# Patient Record
Sex: Female | Born: 1955 | Race: Black or African American | Hispanic: No | Marital: Single | Smoking: Never smoker
Health system: Southern US, Community
[De-identification: ages and names within clinical notes are randomized; demographics above are authoritative.]

## PROBLEM LIST (undated history)

## (undated) DIAGNOSIS — N842 Polyp of vagina: Secondary | ICD-10-CM

## (undated) DIAGNOSIS — F32A Depression, unspecified: Secondary | ICD-10-CM

## (undated) DIAGNOSIS — F4310 Post-traumatic stress disorder, unspecified: Secondary | ICD-10-CM

## (undated) DIAGNOSIS — Z789 Other specified health status: Secondary | ICD-10-CM

## (undated) DIAGNOSIS — F329 Major depressive disorder, single episode, unspecified: Secondary | ICD-10-CM

## (undated) DIAGNOSIS — R87619 Unspecified abnormal cytological findings in specimens from cervix uteri: Secondary | ICD-10-CM

## (undated) DIAGNOSIS — D649 Anemia, unspecified: Secondary | ICD-10-CM

## (undated) DIAGNOSIS — K219 Gastro-esophageal reflux disease without esophagitis: Secondary | ICD-10-CM

## (undated) DIAGNOSIS — F419 Anxiety disorder, unspecified: Secondary | ICD-10-CM

## (undated) HISTORY — DX: Polyp of vagina: N84.2

## (undated) HISTORY — DX: Gastro-esophageal reflux disease without esophagitis: K21.9

## (undated) HISTORY — DX: Depression, unspecified: F32.A

## (undated) HISTORY — DX: Major depressive disorder, single episode, unspecified: F32.9

## (undated) HISTORY — DX: Post-traumatic stress disorder, unspecified: F43.10

## (undated) HISTORY — PX: NO PAST SURGERIES: SHX2092

## (undated) HISTORY — DX: Unspecified abnormal cytological findings in specimens from cervix uteri: R87.619

## (undated) HISTORY — DX: Anemia, unspecified: D64.9

## (undated) HISTORY — DX: Anxiety disorder, unspecified: F41.9

---

## 2007-06-11 HISTORY — PX: COLPOSCOPY: SHX161

## 2016-06-12 ENCOUNTER — Ambulatory Visit: Payer: Self-pay

## 2016-06-26 ENCOUNTER — Ambulatory Visit: Payer: Medicaid Other

## 2016-09-02 ENCOUNTER — Observation Stay
Admission: EM | Admit: 2016-09-02 | Discharge: 2016-09-03 | Disposition: A | Discharge status: Home / Self Care | Payer: Medicaid Other | Attending: Internal Medicine | Admitting: Internal Medicine

## 2016-09-02 ENCOUNTER — Ambulatory Visit (INDEPENDENT_AMBULATORY_CARE_PROVIDER_SITE_OTHER)
Admission: EM | Admit: 2016-09-02 | Discharge: 2016-09-02 | Disposition: A | Discharge status: Home / Self Care | Payer: Medicaid Other | Source: Home / Self Care | Attending: Emergency Medicine | Admitting: Emergency Medicine

## 2016-09-02 ENCOUNTER — Encounter: Payer: Self-pay | Admitting: Internal Medicine

## 2016-09-02 ENCOUNTER — Emergency Department: Payer: Medicaid Other

## 2016-09-02 DIAGNOSIS — R001 Bradycardia, unspecified: Secondary | ICD-10-CM

## 2016-09-02 DIAGNOSIS — R61 Generalized hyperhidrosis: Secondary | ICD-10-CM | POA: Insufficient documentation

## 2016-09-02 DIAGNOSIS — R112 Nausea with vomiting, unspecified: Secondary | ICD-10-CM | POA: Diagnosis not present

## 2016-09-02 DIAGNOSIS — F419 Anxiety disorder, unspecified: Secondary | ICD-10-CM | POA: Insufficient documentation

## 2016-09-02 DIAGNOSIS — R079 Chest pain, unspecified: Secondary | ICD-10-CM | POA: Diagnosis not present

## 2016-09-02 DIAGNOSIS — R55 Syncope and collapse: Secondary | ICD-10-CM | POA: Insufficient documentation

## 2016-09-02 DIAGNOSIS — K219 Gastro-esophageal reflux disease without esophagitis: Secondary | ICD-10-CM | POA: Insufficient documentation

## 2016-09-02 DIAGNOSIS — E876 Hypokalemia: Secondary | ICD-10-CM

## 2016-09-02 DIAGNOSIS — R0602 Shortness of breath: Secondary | ICD-10-CM | POA: Diagnosis not present

## 2016-09-02 HISTORY — DX: Other specified health status: Z78.9

## 2016-09-02 LAB — BASIC METABOLIC PANEL
ANION GAP: 9 (ref 5–15)
BUN: 10 mg/dL (ref 6–20)
CALCIUM: 9.9 mg/dL (ref 8.9–10.3)
CHLORIDE: 104 mmol/L (ref 101–111)
CO2: 27 mmol/L (ref 22–32)
CREATININE: 0.55 mg/dL (ref 0.44–1.00)
GFR calc Af Amer: >60 mL/min (ref >60)
GFR calc non Af Amer: >60 mL/min (ref >60)
Glucose, Bld: 107 mg/dL — ABNORMAL HIGH (ref 65–99)
Potassium: 3.1 mmol/L — ABNORMAL LOW (ref 3.5–5.1)
Sodium: 140 mmol/L (ref 135–145)

## 2016-09-02 LAB — GLUCOSE, CAPILLARY: GLUCOSE-CAPILLARY: 119 mg/dL — AB (ref 65–99)

## 2016-09-02 LAB — CBC
HCT: 40.5 % (ref 35.0–47.0)
Hemoglobin: 13.4 g/dL (ref 12.0–16.0)
MCH: 28 pg (ref 26.0–34.0)
MCHC: 33.1 g/dL (ref 32.0–36.0)
MCV: 84.5 fL (ref 80.0–100.0)
PLATELETS: 255 10*3/uL (ref 150–440)
RBC: 4.79 MIL/uL (ref 3.80–5.20)
RDW: 13.3 % (ref 11.5–14.5)
WBC: 8.5 10*3/uL (ref 3.6–11.0)

## 2016-09-02 LAB — TROPONIN I

## 2016-09-02 MED ORDER — ASPIRIN 81 MG PO CHEW
324.0000 mg | CHEWABLE_TABLET | Freq: Once | ORAL | Status: DC
  Filled 2016-09-02: qty 4

## 2016-09-02 MED ORDER — POTASSIUM CHLORIDE CRYS ER 20 MEQ PO TBCR
40.0000 meq | EXTENDED_RELEASE_TABLET | Freq: Once | ORAL | Status: DC
  Filled 2016-09-02: qty 2

## 2016-09-02 MED ORDER — ONDANSETRON 4 MG PO TBDP
4.0000 mg | ORAL_TABLET | Freq: Once | ORAL | Status: AC
  Administered 2016-09-02: 4 mg via ORAL

## 2016-09-02 MED ORDER — POTASSIUM CHLORIDE CRYS ER 20 MEQ PO TBCR
40.0000 meq | EXTENDED_RELEASE_TABLET | Freq: Once | ORAL | Status: AC
  Administered 2016-09-02: 40 meq via ORAL

## 2016-09-02 MED ORDER — ASPIRIN 81 MG PO CHEW
324.0000 mg | CHEWABLE_TABLET | Freq: Once | ORAL | Status: AC
  Administered 2016-09-02: 324 mg via ORAL

## 2016-09-02 MED ORDER — ONDANSETRON 4 MG PO TBDP
ORAL_TABLET | ORAL | Status: AC
  Filled 2016-09-02: qty 1

## 2016-09-02 NOTE — ED Notes (Signed)
Pt pulled to radiology for chest xray  Protocols not completed

## 2016-09-02 NOTE — ED Triage Notes (Signed)
Pt arrives ambulatory to triage with reports of midsternal chest pain pt reports that it has been happening off and on but today it is worse  She also reports a syncopal episode last week

## 2016-09-02 NOTE — H&P (Signed)
Heidi Palmer at [place] NAME: Heidi Palmer    MR#:  [id number]  DATE OF BIRTH:  [birth year]  DATE OF ADMISSION:  09/02/2016  PRIMARY CARE PHYSICIAN: No PCP Per Patient   REQUESTING/REFERRING PHYSICIAN: Mariea Clonts, MD  CHIEF COMPLAINT:   Chief Complaint  Patient presents with  . Chest Pain    HISTORY OF PRESENT ILLNESS:  Heidi Palmer  is a 61 y.o. female who presents with A myriad of complaints, foremost of which is chest pain. Patient states that she has no medical conditions diagnosed and takes no medications normally. For the past several weeks she's been having intermittent bouts of chest pain. These are centrally located, sometimes with radiation to her back. At times they've been associated with diaphoresis. She states that the pain usually occurs when she is exerting herself or when she feels very "stressed." She also states that she has had several near syncopal episodes. She states that she gets cramping in her legs when she is walking certain distance. Initial workup in the ED is largely within normal limits. Given her symptoms hospitalists were called for admission and further evaluation.  PAST MEDICAL HISTORY:   Past Medical History:  Diagnosis Date  . Patient denies medical problems     PAST SURGICAL HISTORY:   Past Surgical History:  Procedure Laterality Date  . NO PAST SURGERIES      SOCIAL HISTORY:   Social History  Substance Use Topics  . Smoking status: Never Smoker  . Smokeless tobacco: Never Used  . Alcohol use No    FAMILY HISTORY:  No family history on file.  DRUG ALLERGIES:   Allergies  Allergen Reactions  . Codeine Nausea Only  . Penicillins Nausea Only    Has patient had a PCN reaction causing immediate rash, facial/tongue/throat swelling, SOB or lightheadedness with hypotension: No Has patient had a PCN reaction causing severe rash involving mucus membranes or skin necrosis: No Has patient  had a PCN reaction that required hospitalization No Has patient had a PCN reaction occurring within the last 10 years: No If all of the above answers are "NO", then may proceed with Cephalosporin use.    MEDICATIONS AT HOME:   Prior to Admission medications   Not on File    REVIEW OF SYSTEMS:  Review of Systems  Constitutional: Positive for diaphoresis. Negative for chills, fever, malaise/fatigue and weight loss.  HENT: Negative for ear pain, hearing loss and tinnitus.   Eyes: Negative for blurred vision, double vision, pain and redness.  Respiratory: Negative for cough, hemoptysis and shortness of breath.   Cardiovascular: Positive for chest pain and claudication. Negative for palpitations, orthopnea and leg swelling.  Gastrointestinal: Negative for abdominal pain, constipation, diarrhea, nausea and vomiting.  Genitourinary: Negative for dysuria, frequency and hematuria.  Musculoskeletal: Negative for back pain, joint pain and neck pain.  Skin:       No acne, rash, or lesions  Neurological: Negative for dizziness, tremors, focal weakness and weakness.  Endo/Heme/Allergies: Negative for polydipsia. Does not bruise/bleed easily.  Psychiatric/Behavioral: Negative for depression. The patient is not nervous/anxious and does not have insomnia.      VITAL SIGNS:   Vitals:   09/02/16 1846  Weight: 77.1 kg (170 lb)  Height: 5' 6"  (1.676 m)   Wt Readings from Last 3 Encounters:  09/02/16 77.1 kg (170 lb)  09/02/16 77.1 kg (170 lb)    PHYSICAL EXAMINATION:  Physical Exam  Vitals reviewed. Constitutional: She is  oriented to person, place, and time. She appears well-developed and well-nourished. No distress.  HENT:  Head: Normocephalic and atraumatic.  Mouth/Throat: Oropharynx is clear and moist.  Eyes: Conjunctivae and EOM are normal. Pupils are equal, round, and reactive to light. No scleral icterus.  Neck: Normal range of motion. Neck supple. No JVD present. No thyromegaly  present.  Cardiovascular: Normal rate, regular rhythm and intact distal pulses.  Exam reveals no gallop and no friction rub.   No murmur heard. Respiratory: Effort normal and breath sounds normal. No respiratory distress. She has no wheezes. She has no rales.  GI: Soft. Bowel sounds are normal. She exhibits no distension. There is no tenderness.  Musculoskeletal: Normal range of motion. She exhibits no edema.  No arthritis, no gout  Lymphadenopathy:    She has no cervical adenopathy.  Neurological: She is alert and oriented to person, place, and time. No cranial nerve deficit.  No dysarthria, no aphasia  Skin: Skin is warm and dry. No rash noted. No erythema.  Psychiatric: She has a normal mood and affect. Her behavior is normal. Judgment and thought content normal.    LABORATORY PANEL:   CBC  Recent Labs Lab 09/02/16 1901  WBC 8.5  HGB 13.4  HCT 40.5  PLT 255   ------------------------------------------------------------------------------------------------------------------  Chemistries   Recent Labs Lab 09/02/16 1901  NA 140  K 3.1*  CL 104  CO2 27  GLUCOSE 107*  BUN 10  CREATININE 0.55  CALCIUM 9.9   ------------------------------------------------------------------------------------------------------------------  Cardiac Enzymes  Recent Labs Lab 09/02/16 1901  TROPONINI <0.03   ------------------------------------------------------------------------------------------------------------------  RADIOLOGY:  Dg Chest 2 View  Result Date: 09/02/2016 CLINICAL DATA:  Midsternal chest pain EXAM: CHEST  2 VIEW COMPARISON:  None. FINDINGS: The heart size and mediastinal contours are within normal limits. Both lungs are clear. Minimal subpleural atelectasis and/or scarring is seen in the left upper and left lower lobes. The visualized skeletal structures are unremarkable. IMPRESSION: No active cardiopulmonary disease. Electronically Signed   By: Ashley Royalty M.D.   On:  09/02/2016 19:06    EKG:   Orders placed or performed during the hospital encounter of 09/02/16  . ED EKG within 10 minutes  . ED EKG within 10 minutes  . EKG 12-Lead  . EKG 12-Lead    IMPRESSION AND PLAN:  Principal Problem:   Chest pain - serial trend troponins tonight, echocardiogram and cardiology consult in the morning.  All the records are reviewed and case discussed with ED provider. Management plans discussed with the patient and/or family.  DVT PROPHYLAXIS: SubQ lovenox  GI PROPHYLAXIS: None  ADMISSION STATUS: Observation  CODE STATUS: Full Code Status History    This patient does not have a recorded code status. Please follow your organizational policy for patients in this situation.      TOTAL TIME TAKING CARE OF THIS PATIENT: 40 minutes.    Mckinze Poirier [place] 09/02/2016, 11:51 PM  [facility]  Office  [phone]  CC: Primary care physician; No PCP Per Patient

## 2016-09-02 NOTE — Discharge Instructions (Signed)
Your exam is reassuring for muscular skeletal cause of your chest pain, however, he did have several concerning factors history that this could be her heart. Her EKG is fine at this point in time. I think that you're stable to go by private vehicle. Go immediately to the [facility] ED for comprehensive evaluation.

## 2016-09-02 NOTE — ED Provider Notes (Signed)
HPI  SUBJECTIVE:  Heidi Palmer is a 61 y.o. female who presents with multiple complaints. States that she has chest pain, abdominal pain, leg pain that has been going on for quite some time. However, her primary concern seems to be intermittent right-sided substernal chest pain described as achy, lasting from 15 minutes to an hour, waxing and waning. She states that she's had for the past year as gotten worse over the past 3 months. States that she had a particular severe episode last week. She reports occasional nausea, vomiting, diaphoresis, palpitations with this pain but they do not occur together consistently. She reports that this pain occasionally radiates to her back. She also reports syncope, but she states that she has been having episodes syncope for years. She states that back massage and chest massage seem to make her chest pain better, and symptoms are worse with stress, moving her right arm, lifting things and with walking. There is no positional component to it. She reports 3 months of shortness of breath and dyspnea on exertion although states that she is able to go more than 100 feet without getting short of breath. She states that she has been sleeping on 4 pillows for the past for 5 years and she reports 4 months of nocturia. No unintentional weight gain, lower extremity edema. She does report 2 weeks of a cough, no wheezing.   She has a past medical history of syncope for which she has never been evaluated, mitral valve prolapse and "irregular heartbeat". No history of MI, diabetes, hypertension, hypercholesterolemia, atrial fibrillation, SVT, CHF, PE, DVT, cancer, GI bleed, kidney disease. Family history significant for nephew with an MI at age 68. PMD: None. Has not seen a doctor in years.  History reviewed. No pertinent past medical history.  History reviewed. No pertinent surgical history.  History reviewed. No pertinent family history.  Social History  Substance Use Topics   . Smoking status: Never Smoker  . Smokeless tobacco: Never Used  . Alcohol use No    No current facility-administered medications for this encounter.  No current outpatient prescriptions on file.  Allergies  Allergen Reactions  . Codeine Nausea Only  . Penicillins Nausea Only     ROS  As noted in HPI.   Physical Exam  BP 137/78 (BP Location: Left Arm)   Pulse 64   Temp 98.2 F (36.8 C) (Oral)   Resp 18   Ht 5\' 6"  (1.676 m)   Wt 170 lb (77.1 kg)   SpO2 100%   BMI 27.44 kg/m   Constitutional: Well developed, well nourished, no acute distress Eyes: PERRL, EOMI, conjunctiva normal bilaterally HENT: Normocephalic, atraumatic,mucus membranes moist.  Respiratory: Clear to auscultation bilaterally, no rales, no wheezing, no rhonchi Cardiovascular: Normal rate and rhythm, no murmurs, no gallops, no rubs. Positive right sided tenderness along the costochondral junctions which she states reproduces her pain. Pain is reproduced by torso rotation and moving her arm. Positive tenderness along the corresponding posterior ribs. GI: Soft, nondistended, normal bowel sounds, nontender, no rebound, no guarding Back: no CVAT skin: No rash, skin intact Musculoskeletal: No edema, no tenderness, no deformities Neurologic: Alert & oriented x 3, CN II-XII grossly intact, no motor deficits, sensation grossly intact Psychiatric: Speech and behavior appropriate   ED Course   Medications - No data to display  Orders Placed This Encounter  Procedures  . ED EKG    Standing Status:   Standing    Number of Occurrences:   1  Order Specific Question:   Reason for Exam    Answer:   Chest Pain  . EKG 12-Lead    Standing Status:   Standing    Number of Occurrences:   1  . EKG 12-Lead    Standing Status:   Standing    Number of Occurrences:   1   No results found for this or any previous visit (from the past 24 hour(s)). No results found.  ED Clinical Impression  Chest pain,  unspecified type   ED Assessment/Plan  [place] narcotic database reviewed, No opiate rx in past 6 months.   EKG: Sinus bradycardia rate 59. Normal axis. Normal intervals. No hypertrophy. No ST-T wave elevation. No previous EKG for comparison.  Patient's exam is certainly suggestive of musculoskeletal cause her chest pain, however, she is reporting syncope which she has never been evaluated  before, occasional nausea vomiting and diaphoresis with this chest pain. . She does have several risk factors most specifically her age for this chest pain to be of cardiac origin. Doubt PE. Her EKG is normal. [facility] ED for comprehensive evaluation. Feel that she is stable to go by private vehicle. Notified charge Charity fundraiserN at [facility].    No orders of the defined types were placed in this encounter.   *This clinic note was created using Dragon dictation software. Therefore, there may be occasional mistakes despite careful proofreading.  ?   Domenick GongAshley Pammie Chirino, MD 09/02/16 [phone]

## 2016-09-02 NOTE — ED Triage Notes (Signed)
Pt c/o new mobility issues, she says her gait is unsteady at times and she has been falling with the last fall 3 weeks ago. She mentions that when she wakes up in the morning her feet hurt really bad. She says she is also having stomach pains on the left lower quadrant. She also c/o sciatic nerve pain.

## 2016-09-02 NOTE — ED Provider Notes (Signed)
[facility] Emergency Department Provider Note  ____________________________________________  Time seen: Approximately 10:28 PM  I have reviewed the triage vital signs and the nursing notes.   HISTORY  Chief Complaint Chest Pain    HPI Heidi Palmer is a 61 y.o. female , otherwise healthy, presenting for chest pain. The patient reports that for the past year, she has had intermittent episodes of chest pain that she describes as a squeezing sensation in the center of the chest that does not radiate but that is associated with shortness of breath, diaphoresis, nausea and occasional vomiting. No palpitations, lightheadedness or syncope. It is worse when she tries to lift something heavy or shakes that exerting herself and she has had decreased exercise tolerance due to this. Over the past several weeks, the pain has become more frequent. She also reports that when she is having it "my legs feel like lead." To make the pain better, she can press on her chest or have someone else press over her scapula. She has not sought any medical attention for these symptoms. She has never had a risk stratification study.   No past medical history on file.  There are no active problems to display for this patient.   No past surgical history on file.    Allergies Codeine and Penicillins  No family history on file.  Social History Social History  Substance Use Topics  . Smoking status: Never Smoker  . Smokeless tobacco: Never Used  . Alcohol use No    Review of Systems Constitutional: No fever/chills.No lightheadedness or syncope. Positive diaphoresis. Eyes: No visual changes. ENT: No sore throat. No congestion or rhinorrhea. Cardiovascular: Positive chest pain. Denies palpitations. Respiratory: Positive shortness of breath.  No cough. Gastrointestinal: No abdominal pain.  Positive nausea, positive vomiting.  No diarrhea.  No constipation. Genitourinary: Negative  for dysuria. Musculoskeletal: Positive for back pain. Skin: Negative for rash. Neurological: Negative for headaches. No focal numbness, tingling or weakness. Positive "heavy" legs.  10-point ROS otherwise negative.  ____________________________________________   PHYSICAL EXAM:  VITAL SIGNS: ED Triage Vitals  Enc Vitals Group     BP --      Pulse --      Resp --      Temp --      Temp src --      SpO2 --      Weight 09/02/16 1846 170 lb (77.1 kg)     Height 09/02/16 1846 5\' 6"  (1.676 m)     Head Circumference --      Peak Flow --      Pain Score 09/02/16 1845 9     Pain Loc --      Pain Edu? --      Excl. in GC? --     Constitutional: Alert and oriented. Well appearing and in no acute distress. Answers questions appropriately. Eyes: Conjunctivae are normal.  EOMI. No scleral icterus. Head: Atraumatic. Nose: No congestion/rhinnorhea. Mouth/Throat: Mucous membranes are moist.  Neck: No stridor.  Supple.  No JVD. Cardiovascular: Normal rate, regular rhythm. No murmurs, rubs or gallops.  Respiratory: Normal respiratory effort.  No accessory muscle use or retractions. Lungs CTAB.  No wheezes, rales or ronchi. Gastrointestinal: Soft, nontender and nondistended.  No guarding or rebound.  No peritoneal signs. Musculoskeletal: No LE edema. No ttp in the calves or palpable cords.  Negative Homan's sign. Neurologic:  A&Ox3.  Speech is clear.  Face and smile are symmetric.  EOMI.  Moves all extremities  well. Skin:  Skin is warm, dry and intact. No rash noted. Psychiatric: Mood and affect are normal. Speech and behavior are normal.  Normal judgement.  ____________________________________________   LABS (all labs ordered are listed, but only abnormal results are displayed)  Labs Reviewed  BASIC METABOLIC PANEL - Abnormal; Notable for the following:       Result Value   Potassium 3.1 (*)    Glucose, Bld 107 (*)    All other components within normal limits  GLUCOSE, CAPILLARY -  Abnormal; Notable for the following:    Glucose-Capillary 119 (*)    All other components within normal limits  CBC  TROPONIN I   ____________________________________________  EKG  ED ECG REPORT I, Rockne Menghini, the attending physician, personally viewed and interpreted this ECG.   Date: 09/02/2016  EKG Time: 1851  Rate: 86  Rhythm: normal sinus rhythm  Axis: normal  Intervals:none  ST&T Change: No STEMI  ____________________________________________  RADIOLOGY  Dg Chest 2 View  Result Date: 09/02/2016 CLINICAL DATA:  Midsternal chest pain EXAM: CHEST  2 VIEW COMPARISON:  None. FINDINGS: The heart size and mediastinal contours are within normal limits. Both lungs are clear. Minimal subpleural atelectasis and/or scarring is seen in the left upper and left lower lobes. The visualized skeletal structures are unremarkable. IMPRESSION: No active cardiopulmonary disease. Electronically Signed   By: Tollie Eth M.D.   On: 09/02/2016 19:06    ____________________________________________   PROCEDURES  Procedure(s) performed: None  Procedures  Critical Care performed: No ____________________________________________   INITIAL IMPRESSION / ASSESSMENT AND PLAN / ED COURSE  Pertinent labs & imaging results that were available during my care of the patient were reviewed by me and considered in my medical decision making (see chart for details).  61 y.o. female, otherwise healthy, presenting with progressively worsening episodes of exertional chest pain associated with shortness of breath, diaphoresis, nausea and vomiting. The patient does have some atypical characteristics to her discomfort, but I am concerned that this may be related to CAD. The patient will be given aspirin, and will be admitted to the hospital for risk stratification study.  ____________________________________________  FINAL CLINICAL IMPRESSION(S) / ED DIAGNOSES  Final diagnoses:  Hypokalemia   Chest pain, unspecified type  Shortness of breath  Diaphoresis  Non-intractable vomiting with nausea, unspecified vomiting type         NEW MEDICATIONS STARTED DURING THIS VISIT:  New Prescriptions   No medications on file      Rockne Menghini, MD 09/02/16 2234

## 2016-09-03 ENCOUNTER — Encounter: Payer: Self-pay | Admitting: *Deleted

## 2016-09-03 ENCOUNTER — Observation Stay: Admit: 2016-09-03 | Payer: Medicaid Other

## 2016-09-03 LAB — CBC
HEMATOCRIT: 38.1 % (ref 35.0–47.0)
HEMOGLOBIN: 12.7 g/dL (ref 12.0–16.0)
MCH: 27.4 pg (ref 26.0–34.0)
MCHC: 33.2 g/dL (ref 32.0–36.0)
MCV: 82.5 fL (ref 80.0–100.0)
Platelets: 236 10*3/uL (ref 150–440)
RBC: 4.62 MIL/uL (ref 3.80–5.20)
RDW: 13.3 % (ref 11.5–14.5)
WBC: 6.9 10*3/uL (ref 3.6–11.0)

## 2016-09-03 LAB — TROPONIN I
Troponin I: <0.03 ng/mL (ref <0.03)
Troponin I: <0.03 ng/mL (ref <0.03)
Troponin I: <0.03 ng/mL (ref <0.03)

## 2016-09-03 LAB — BASIC METABOLIC PANEL
ANION GAP: 6 (ref 5–15)
BUN: 9 mg/dL (ref 6–20)
CHLORIDE: 107 mmol/L (ref 101–111)
CO2: 26 mmol/L (ref 22–32)
Calcium: 9.3 mg/dL (ref 8.9–10.3)
Creatinine, Ser: 0.77 mg/dL (ref 0.44–1.00)
GFR calc non Af Amer: >60 mL/min (ref >60)
Glucose, Bld: 103 mg/dL — ABNORMAL HIGH (ref 65–99)
POTASSIUM: 3.9 mmol/L (ref 3.5–5.1)
Sodium: 139 mmol/L (ref 135–145)

## 2016-09-03 MED ORDER — ENOXAPARIN SODIUM 40 MG/0.4ML ~~LOC~~ SOLN
40.0000 mg | SUBCUTANEOUS | Status: DC
  Administered 2016-09-03: 40 mg via SUBCUTANEOUS
  Filled 2016-09-03: qty 0.4

## 2016-09-03 MED ORDER — ACETAMINOPHEN 325 MG PO TABS: 650.0000 mg | ORAL_TABLET | Freq: Four times a day (QID) | ORAL | Status: DC | PRN

## 2016-09-03 MED ORDER — ONDANSETRON HCL 4 MG/2ML IJ SOLN: 4.0000 mg | Freq: Four times a day (QID) | INTRAMUSCULAR | Status: DC | PRN

## 2016-09-03 MED ORDER — HYDROCODONE-ACETAMINOPHEN 5-325 MG PO TABS: 1.0000 | ORAL_TABLET | ORAL | Status: DC | PRN

## 2016-09-03 MED ORDER — ONDANSETRON HCL 4 MG PO TABS: 4.0000 mg | ORAL_TABLET | Freq: Four times a day (QID) | ORAL | Status: DC | PRN

## 2016-09-03 MED ORDER — ACETAMINOPHEN 650 MG RE SUPP: 650.0000 mg | Freq: Four times a day (QID) | RECTAL | Status: DC | PRN

## 2016-09-03 NOTE — Plan of Care (Signed)
Problem: Pain Managment: Goal: General experience of comfort will improve Outcome: Progressing No complaints of pain this shift, prn medications  Problem: Tissue Perfusion: Goal: Risk factors for ineffective tissue perfusion will decrease Outcome: Progressing SQ Lovenox  Problem: Activity: Goal: Ability to tolerate increased activity will improve Outcome: Progressing No complaints of chest pain

## 2016-09-03 NOTE — Progress Notes (Signed)
Pt discharged to home via wc.  Instructions  given to pt.  Questions answered.  No distress.  

## 2016-09-03 NOTE — Discharge Summary (Signed)
[facility] NAME: Heidi Palmer    MR#:  [id number]  DATE OF BIRTH:  08/29/1955  DATE OF ADMISSION:  09/02/2016 ADMITTING PHYSICIAN: Lance Coon, MD  DATE OF DISCHARGE: 09/03/2016  PRIMARY CARE PHYSICIAN: No PCP Per Patient    ADMISSION DIAGNOSIS:  Shortness of breath [R06.02] Hypokalemia [E87.6] Diaphoresis [R61] Chest pain, unspecified type [R07.9] Non-intractable vomiting with nausea, unspecified vomiting type [R11.2]  DISCHARGE DIAGNOSIS:  Principal Problem:   Chest pain     SECONDARY DIAGNOSIS:   Past Medical History:  Diagnosis Date  . Patient denies medical problems     HOSPITAL COURSE:   CAME WITH chest pain, on-off for few weeks. Troponins and tele monitoring negative. Seen by cardiologist- Dr. Clayborn Bigness- suggested to have stress test as out pt.  DISCHARGE CONDITIONS:   Stable.  CONSULTS OBTAINED:  Treatment Team:  Yolonda Kida, MD  DRUG ALLERGIES:   Allergies  Allergen Reactions  . Codeine Nausea Only  . Penicillins Nausea Only    Has patient had a PCN reaction causing immediate rash, facial/tongue/throat swelling, SOB or lightheadedness with hypotension: No Has patient had a PCN reaction causing severe rash involving mucus membranes or skin necrosis: No Has patient had a PCN reaction that required hospitalization No Has patient had a PCN reaction occurring within the last 10 years: No If all of the above answers are "NO", then may proceed with Cephalosporin use.    DISCHARGE MEDICATIONS:  There are no discharge medications for this patient.    DISCHARGE INSTRUCTIONS:    Follow with Dr.Callwood's office in 1 week.  If you experience worsening of your admission symptoms, develop shortness of breath, life threatening emergency, suicidal or homicidal thoughts you must seek medical attention immediately by calling 911 or calling your MD immediately  if symptoms less severe.  You  Must read complete instructions/literature along with all the possible adverse reactions/side effects for all the Medicines you take and that have been prescribed to you. Take any new Medicines after you have completely understood and accept all the possible adverse reactions/side effects.   Please note  You were cared for by a hospitalist during your hospital stay. If you have any questions about your discharge medications or the care you received while you were in the hospital after you are discharged, you can call the unit and asked to speak with the hospitalist on call if the hospitalist that took care of you is not available. Once you are discharged, your primary care physician will handle any further medical issues. Please note that NO REFILLS for any discharge medications will be authorized once you are discharged, as it is imperative that you return to your primary care physician (or establish a relationship with a primary care physician if you do not have one) for your aftercare needs so that they can reassess your need for medications and monitor your lab values.    Today   CHIEF COMPLAINT:   Chief Complaint  Patient presents with  . Chest Pain    HISTORY OF PRESENT ILLNESS:  Heidi Palmer  is a 61 y.o. female presents with A myriad of complaints, foremost of which is chest pain. Patient states that she has no medical conditions diagnosed and takes no medications normally. For the past several weeks she's been having intermittent bouts of chest pain. These are centrally located, sometimes with radiation to her back. At times they've been associated with diaphoresis. She states that the  pain usually occurs when she is exerting herself or when she feels very "stressed." She also states that she has had several near syncopal episodes. She states that she gets cramping in her legs when she is walking certain distance. Initial workup in the ED is largely within normal limits. Given her  symptoms hospitalists were called for admission and further evaluation.  VITAL SIGNS:  Blood pressure 110/68, pulse (!) 56, temperature 97.7 F (36.5 C), temperature source Oral, resp. rate 16, height 5' 6"  (1.676 m), weight 79.4 kg (175 lb 1.6 oz), SpO2 100 %.  I/O:  No intake or output data in the 24 hours ending 09/03/16 1232  PHYSICAL EXAMINATION:  GENERAL:  61 y.o.-year-old patient lying in the bed with no acute distress.  EYES: Pupils equal, round, reactive to light and accommodation. No scleral icterus. Extraocular muscles intact.  HEENT: Head atraumatic, normocephalic. Oropharynx and nasopharynx clear.  NECK:  Supple, no jugular venous distention. No thyroid enlargement, no tenderness.  LUNGS: Normal breath sounds bilaterally, no wheezing, rales,rhonchi or crepitation. No use of accessory muscles of respiration.  CARDIOVASCULAR: S1, S2 normal. No murmurs, rubs, or gallops.  ABDOMEN: Soft, non-tender, non-distended. Bowel sounds present. No organomegaly or mass.  EXTREMITIES: No pedal edema, cyanosis, or clubbing.  NEUROLOGIC: Cranial nerves II through XII are intact. Muscle strength 5/5 in all extremities. Sensation intact. Gait not checked.  PSYCHIATRIC: The patient is alert and oriented x 3.  SKIN: No obvious rash, lesion, or ulcer.   DATA REVIEW:   CBC  Recent Labs Lab 09/03/16 0723  WBC 6.9  HGB 12.7  HCT 38.1  PLT 236    Chemistries   Recent Labs Lab 09/03/16 0723  NA 139  K 3.9  CL 107  CO2 26  GLUCOSE 103*  BUN 9  CREATININE 0.77  CALCIUM 9.3    Cardiac Enzymes  Recent Labs Lab 09/03/16 0723  TROPONINI <0.03    Microbiology Results  No results found for this or any previous visit.  RADIOLOGY:  Dg Chest 2 View  Result Date: 09/02/2016 CLINICAL DATA:  Midsternal chest pain EXAM: CHEST  2 VIEW COMPARISON:  None. FINDINGS: The heart size and mediastinal contours are within normal limits. Both lungs are clear. Minimal subpleural atelectasis  and/or scarring is seen in the left upper and left lower lobes. The visualized skeletal structures are unremarkable. IMPRESSION: No active cardiopulmonary disease. Electronically Signed   By: Ashley Royalty M.D.   On: 09/02/2016 19:06    EKG:   Orders placed or performed during the hospital encounter of 09/02/16  . ED EKG within 10 minutes  . ED EKG within 10 minutes  . EKG 12-Lead  . EKG 12-Lead      Management plans discussed with the patient, family and they are in agreement.  CODE STATUS:     Code Status Orders        Start     Ordered   09/03/16 0106  Full code  Continuous     09/03/16 0105    Code Status History    Date Active Date Inactive Code Status Order ID Comments User Context   This patient has a current code status but no historical code status.      TOTAL TIME TAKING CARE OF THIS PATIENT: 35 minutes.    Vaughan Basta M.D on 09/03/2016 at 12:32 PM  Between 7am to 6pm - Pager - [phone]  After 6pm go to [email or web address] - password [email or web address]  [employer]  Office  [phone]  CC: Primary care physician; No PCP Per Patient   Note: This dictation was prepared with Dragon dictation along with smaller phrase technology. Any transcriptional errors that result from this process are unintentional.

## 2016-09-03 NOTE — Plan of Care (Signed)
Problem: Pain Managment: Goal: General experience of comfort will improve Outcome: Progressing Pt with no complaints of pain this shift. Will continue to monitor.  Problem: Tissue Perfusion: Goal: Risk factors for ineffective tissue perfusion will decrease Outcome: Progressing Subcutaneous lovenox given

## 2016-09-04 NOTE — Consult Note (Signed)
Reason for Consult: Chest pain Referring Physician: Hospitalist Dr. Vachhani  Heidi Palmer is an 60 y.o. female.  HPI: Patient presents with recent onset of chest pain symptoms patient is on no medication no significant recent hospital or doctor visits. Patient describes recent onset of recurrent chest pain midsternal centrally radiating to her back. Patient states to been under a lot of stress patient has had no syncopal episodes no worsening shortness of breath no leg edema. Patient complains of leg cramping with significant walking doesn't recent chest pain patient was admitted for further evaluation EKG was unremarkable  Past Medical History:  Diagnosis Date  . Patient denies medical problems     Past Surgical History:  Procedure Laterality Date  . NO PAST SURGERIES      History reviewed. No pertinent family history.  Social History:  reports that she has never smoked. She has never used smokeless tobacco. She reports that she does not drink alcohol or use drugs.  Allergies:  Allergies  Allergen Reactions  . Codeine Nausea Only  . Penicillins Nausea Only    Has patient had a PCN reaction causing immediate rash, facial/tongue/throat swelling, SOB or lightheadedness with hypotension: No Has patient had a PCN reaction causing severe rash involving mucus membranes or skin necrosis: No Has patient had a PCN reaction that required hospitalization No Has patient had a PCN reaction occurring within the last 10 years: No If all of the above answers are "NO", then may proceed with Cephalosporin use.    Medications: I have reviewed the patient's current medications.  Results for orders placed or performed during the hospital encounter of 09/02/16 (from the past 48 hour(s))  Troponin I     Status: None   Collection Time: 09/03/16  1:12 AM  Result Value Ref Range   Troponin I <0.03 <0.03 ng/mL  Troponin I     Status: None   Collection Time: 09/03/16  7:23 AM  Result Value Ref Range    Troponin I <0.03 <0.03 ng/mL  Basic metabolic panel     Status: Abnormal   Collection Time: 09/03/16  7:23 AM  Result Value Ref Range   Sodium 139 135 - 145 mmol/L   Potassium 3.9 3.5 - 5.1 mmol/L   Chloride 107 101 - 111 mmol/L   CO2 26 22 - 32 mmol/L   Glucose, Bld 103 (H) 65 - 99 mg/dL   BUN 9 6 - 20 mg/dL   Creatinine, Ser 0.77 0.44 - 1.00 mg/dL   Calcium 9.3 8.9 - 10.3 mg/dL   GFR calc non Af Amer >60 >60 mL/min   GFR calc Af Amer >60 >60 mL/min    Comment: (NOTE) The eGFR has been calculated using the CKD EPI equation. This calculation has not been validated in all clinical situations. eGFR's persistently <60 mL/min signify possible Chronic Kidney Disease.    Anion gap 6 5 - 15  CBC     Status: None   Collection Time: 09/03/16  7:23 AM  Result Value Ref Range   WBC 6.9 3.6 - 11.0 K/uL   RBC 4.62 3.80 - 5.20 MIL/uL   Hemoglobin 12.7 12.0 - 16.0 g/dL   HCT 38.1 35.0 - 47.0 %   MCV 82.5 80.0 - 100.0 fL   MCH 27.4 26.0 - 34.0 pg   MCHC 33.2 32.0 - 36.0 g/dL   RDW 13.3 11.5 - 14.5 %   Platelets 236 150 - 440 K/uL  Troponin I     Status: None     Collection Time: 09/03/16  1:22 PM  Result Value Ref Range   Troponin I <0.03 <0.03 ng/mL    No results found.  Review of Systems  Constitutional: Positive for malaise/fatigue.  HENT: Positive for congestion.   Eyes: Negative.   Respiratory: Positive for shortness of breath.   Cardiovascular: Positive for chest pain and palpitations.  Gastrointestinal: Negative.   Genitourinary: Negative.   Musculoskeletal: Negative.   Skin: Negative.   Neurological: Positive for weakness.  Endo/Heme/Allergies: Negative.   Psychiatric/Behavioral: Negative.    Blood pressure 110/68, pulse (!) 56, temperature 97.7 F (36.5 C), temperature source Oral, resp. rate 16, height 5' 6" (1.676 m), weight 79.4 kg (175 lb 1.6 oz), SpO2 100 %. Physical Exam  Nursing note and vitals reviewed. Constitutional: She is oriented to person, place, and  time. She appears well-developed and well-nourished.  HENT:  Head: Normocephalic and atraumatic.  Eyes: Conjunctivae and EOM are normal. Pupils are equal, round, and reactive to light.  Neck: Normal range of motion. Neck supple.  Cardiovascular: Normal rate, regular rhythm and normal heart sounds.   Respiratory: Effort normal and breath sounds normal.  GI: Soft. Bowel sounds are normal.  Musculoskeletal: Normal range of motion.  Neurological: She is alert and oriented to person, place, and time. She has normal reflexes.  Skin: Skin is warm and dry.  Psychiatric: She has a normal mood and affect.    Assessment/Plan: Chest pain Hypokalemia GERD mild Syncope Possible myocardial infarction in the past Anxiety and stress Possible angina . Plan Agree with admit for myocardial infarction Agree with evaluation with telemetry and EKGs Consider further evaluation possibly an outpatient with functional study Corrected electrolytes especially potassium Consider echocardiogram can be done as an outpatient Recommend evaluation by neurology for possible syncope Holter monitor may also be helpful Recommend medical therapy possibly for angina as well as tachycardia possibly with a beta blocker   D  09/04/2016, 9:15 PM     

## 2016-09-17 ENCOUNTER — Ambulatory Visit
Admission: RE | Admit: 2016-09-17 | Discharge: 2016-09-17 | Disposition: A | Discharge status: Home / Self Care | Payer: Medicaid Other | Source: Ambulatory Visit | Attending: Nurse Practitioner | Admitting: Nurse Practitioner

## 2016-09-17 ENCOUNTER — Encounter: Payer: Self-pay | Admitting: Nurse Practitioner

## 2016-09-17 ENCOUNTER — Ambulatory Visit (INDEPENDENT_AMBULATORY_CARE_PROVIDER_SITE_OTHER): Payer: Medicaid Other | Admitting: Nurse Practitioner

## 2016-09-17 VITALS — BP 105/78 | HR 72 | Temp 98.6°F | Resp 16 | Ht 66.0 in | Wt 178.0 lb

## 2016-09-17 DIAGNOSIS — M4316 Spondylolisthesis, lumbar region: Secondary | ICD-10-CM | POA: Insufficient documentation

## 2016-09-17 DIAGNOSIS — M5416 Radiculopathy, lumbar region: Secondary | ICD-10-CM | POA: Diagnosis present

## 2016-09-17 DIAGNOSIS — Z7689 Persons encountering health services in other specified circumstances: Secondary | ICD-10-CM

## 2016-09-17 DIAGNOSIS — S025XXB Fracture of tooth (traumatic), initial encounter for open fracture: Secondary | ICD-10-CM | POA: Diagnosis not present

## 2016-09-17 DIAGNOSIS — R195 Other fecal abnormalities: Secondary | ICD-10-CM | POA: Diagnosis not present

## 2016-09-17 DIAGNOSIS — M79605 Pain in left leg: Secondary | ICD-10-CM | POA: Diagnosis not present

## 2016-09-17 LAB — COMPLETE METABOLIC PANEL WITH GFR
ALT: 23 U/L (ref 6–29)
AST: 19 U/L (ref 10–35)
Albumin: 4.3 g/dL (ref 3.6–5.1)
Alkaline Phosphatase: 107 U/L (ref 33–130)
BUN: 13 mg/dL (ref 7–25)
CO2: 25 mmol/L (ref 20–31)
Calcium: 9.4 mg/dL (ref 8.6–10.4)
Chloride: 107 mmol/L (ref 98–110)
Creat: 0.82 mg/dL (ref 0.50–1.05)
GFR, Est African American: >89 mL/min (ref >=60)
GFR, Est Non African American: 79 mL/min (ref >=60)
Glucose, Bld: 101 mg/dL — ABNORMAL HIGH (ref 65–99)
Potassium: 4 mmol/L (ref 3.5–5.3)
Sodium: 141 mmol/L (ref 135–146)
Total Bilirubin: 0.4 mg/dL (ref 0.2–1.2)
Total Protein: 7.7 g/dL (ref 6.1–8.1)

## 2016-09-17 MED ORDER — GABAPENTIN 100 MG PO CAPS: 100.0000 mg | ORAL_CAPSULE | Freq: Three times a day (TID) | ORAL | 3 refills | Status: AC | PRN

## 2016-09-17 NOTE — Patient Instructions (Signed)
Heidi Palmer, Thank you for coming in to clinic today.  1. For your leg pain: Get an X-ray before you leave.  We will refer you to a spine doctor if there is anything wrong. - For the pain take gabapentin 100 mg tablet up to 3 times per day (every 8 hours) as needed for pain.  2. For your teeth: seek care at a dentist.  I have made a referral.  Please schedule a follow-up appointment with [name], AGNP in 1 week for depression and in 2 months for annual physical .  If you have any other questions or concerns, please feel free to call the clinic or send a message through MyChart. You may also schedule an earlier appointment if necessary.  [name], DNP, AGNP-BC Adult Gerontology Nurse Practitioner [facility], [facility]    Gabapentin capsules or tablets What is this medicine? GABAPENTIN (GA ba pen tin) is used to control partial seizures in adults with epilepsy. It is also used to treat certain types of nerve pain. This medicine may be used for other purposes; ask your health care provider or pharmacist if you have questions. COMMON BRAND NAME(S): Active-PAC with Gabapentin, Gabarone, Neurontin What should I tell my health care provider before I take this medicine? They need to know if you have any of these conditions: -kidney disease -suicidal thoughts, plans, or attempt; a previous suicide attempt by you or a family member -an unusual or allergic reaction to gabapentin, other medicines, foods, dyes, or preservatives -pregnant or trying to get pregnant -breast-feeding How should I use this medicine? Take this medicine by mouth with a glass of water. Follow the directions on the prescription label. You can take it with or without food. If it upsets your stomach, take it with food.Take your medicine at regular intervals. Do not take it more often than directed. Do not stop taking except on your doctor's advice. If you are directed to break the 600 or 800 mg tablets in  half as part of your dose, the extra half tablet should be used for the next dose. If you have not used the extra half tablet within 28 days, it should be thrown away. A special MedGuide will be given to you by the pharmacist with each prescription and refill. Be sure to read this information carefully each time. Talk to your pediatrician regarding the use of this medicine in children. Special care may be needed. Overdosage: If you think you have taken too much of this medicine contact a poison control center or emergency room at once. NOTE: This medicine is only for you. Do not share this medicine with others. What if I miss a dose? If you miss a dose, take it as soon as you can. If it is almost time for your next dose, take only that dose. Do not take double or extra doses. What may interact with this medicine? Do not take this medicine with any of the following medications: -other gabapentin products This medicine may also interact with the following medications: -alcohol -antacids -antihistamines for allergy, cough and cold -certain medicines for anxiety or sleep -certain medicines for depression or psychotic disturbances -homatropine; hydrocodone -naproxen -narcotic medicines (opiates) for pain -phenothiazines like chlorpromazine, mesoridazine, prochlorperazine, thioridazine This list may not describe all possible interactions. Give your health care provider a list of all the medicines, herbs, non-prescription drugs, or dietary supplements you use. Also tell them if you smoke, drink alcohol, or use illegal drugs. Some items may interact with  your medicine. What should I watch for while using this medicine? Visit your doctor or health care professional for regular checks on your progress. You may want to keep a record at home of how you feel your condition is responding to treatment. You may want to share this information with your doctor or health care professional at each visit. You  should contact your doctor or health care professional if your seizures get worse or if you have any new types of seizures. Do not stop taking this medicine or any of your seizure medicines unless instructed by your doctor or health care professional. Stopping your medicine suddenly can increase your seizures or their severity. Wear a medical identification bracelet or chain if you are taking this medicine for seizures, and carry a card that lists all your medications. You may get drowsy, dizzy, or have blurred vision. Do not drive, use machinery, or do anything that needs mental alertness until you know how this medicine affects you. To reduce dizzy or fainting spells, do not sit or stand up quickly, especially if you are an older patient. Alcohol can increase drowsiness and dizziness. Avoid alcoholic drinks. Your mouth may get dry. Chewing sugarless gum or sucking hard candy, and drinking plenty of water will help. The use of this medicine may increase the chance of suicidal thoughts or actions. Pay special attention to how you are responding while on this medicine. Any worsening of mood, or thoughts of suicide or dying should be reported to your health care professional right away. Women who become pregnant while using this medicine may enroll in the [place] American Antiepileptic Drug Pregnancy Registry by calling [phone]. This registry collects information about the safety of antiepileptic drug use during pregnancy. What side effects may I notice from receiving this medicine? Side effects that you should report to your doctor or health care professional as soon as possible: -allergic reactions like skin rash, itching or hives, swelling of the face, lips, or tongue -worsening of mood, thoughts or actions of suicide or dying Side effects that usually do not require medical attention (report to your doctor or health care professional if they continue or are bothersome): -constipation -difficulty  walking or controlling muscle movements -dizziness -nausea -slurred speech -tiredness -tremors -weight gain This list may not describe all possible side effects. Call your doctor for medical advice about side effects. You may report side effects to FDA at 1-800-FDA-1088. Where should I keep my medicine? Keep out of reach of children. This medicine may cause accidental overdose and death if it taken by other adults, children, or pets. Mix any unused medicine with a substance like cat litter or coffee grounds. Then throw the medicine away in a sealed container like a sealed bag or a coffee can with a lid. Do not use the medicine after the expiration date. Store at room temperature between 15 and 30 degrees C (59 and 86 degrees F). NOTE: This sheet is a summary. It may not cover all possible information. If you have questions about this medicine, talk to your doctor, pharmacist, or health care provider.  2018 Elsevier/Gold Standard (2013-07-23 15:26:50)
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
## 2016-09-18 ENCOUNTER — Telehealth: Payer: Self-pay | Admitting: *Deleted

## 2016-09-18 LAB — HEMOGLOBIN A1C
Hgb A1c MFr Bld: 5.6 % (ref <5.7)
Mean Plasma Glucose: 114 mg/dL

## 2016-09-18 NOTE — Telephone Encounter (Signed)
Called patient to give # for dentist accepting new patient/Medicaid. Dr. Rolly Pancake [address] [place] [phone].

## 2016-09-20 ENCOUNTER — Other Ambulatory Visit: Payer: Self-pay | Admitting: Internal Medicine

## 2016-09-20 DIAGNOSIS — I2089 Other forms of angina pectoris: Secondary | ICD-10-CM

## 2016-09-20 DIAGNOSIS — I208 Other forms of angina pectoris: Secondary | ICD-10-CM

## 2016-09-20 DIAGNOSIS — M79605 Pain in left leg: Secondary | ICD-10-CM | POA: Insufficient documentation

## 2016-09-20 NOTE — Progress Notes (Signed)
I have reviewed this encounter including the documentation in this note and/or discussed this patient with the provider, [name], AGPCNP-BC. I am certifying that I agree with the content of this note as supervising physician.  Saralyn Pilar, DO [facility] 09/20/2016, 6:11 PM

## 2016-09-23 ENCOUNTER — Ambulatory Visit
Admission: RE | Admit: 2016-09-23 | Discharge: 2016-09-23 | Disposition: A | Discharge status: Home / Self Care | Payer: Medicaid Other | Source: Ambulatory Visit | Attending: Internal Medicine | Admitting: Internal Medicine

## 2016-10-10 ENCOUNTER — Ambulatory Visit
Admission: RE | Admit: 2016-10-10 | Discharge: 2016-10-10 | Disposition: A | Discharge status: Home / Self Care | Payer: Medicaid Other | Source: Ambulatory Visit | Attending: Internal Medicine | Admitting: Internal Medicine

## 2016-10-10 DIAGNOSIS — I208 Other forms of angina pectoris: Secondary | ICD-10-CM | POA: Diagnosis not present

## 2016-10-10 LAB — NM MYOCAR MULTI W/SPECT W/WALL MOTION / EF
CHL CUP NUCLEAR SSS: 0
CSEPED: 1 min
CSEPHR: 74 %
CSEPPHR: 121 {beats}/min
Estimated workload: 1 METS
Exercise duration (sec): 0 s
LVDIAVOL: 67 mL (ref 46–106)
LVSYSVOL: 20 mL
MPHR: 162 {beats}/min
Rest HR: 59 {beats}/min
SDS: 0
SRS: 2
TID: 1.13

## 2016-10-10 MED ORDER — REGADENOSON 0.4 MG/5ML IV SOLN
0.4000 mg | Freq: Once | INTRAVENOUS | Status: AC
  Administered 2016-10-10: 0.4 mg via INTRAVENOUS

## 2016-10-10 MED ORDER — TECHNETIUM TC 99M TETROFOSMIN IV KIT
12.0000 | PACK | Freq: Once | INTRAVENOUS | Status: AC | PRN
  Administered 2016-10-10: 13.97 via INTRAVENOUS

## 2016-10-10 MED ORDER — TECHNETIUM TC 99M TETROFOSMIN IV KIT
30.1510 | PACK | Freq: Once | INTRAVENOUS | Status: AC | PRN
  Administered 2016-10-10: 30.151 via INTRAVENOUS

## 2016-10-11 ENCOUNTER — Telehealth: Payer: Self-pay | Admitting: Nurse Practitioner

## 2016-10-11 NOTE — Telephone Encounter (Signed)
I have seen Ms. Heidi Palmer for an establish care and acute visit.  We have not had an annual physical.  Please schedule an annual physical exam so we can discuss healthcare risks and address all recommended screenings.

## 2016-10-11 NOTE — Telephone Encounter (Signed)
LMTCB

## 2016-10-11 NOTE — Telephone Encounter (Signed)
Pt. Called requesting an   Order  For  Bone  Destiny.  And   Mammogram. P t call back  [phone]

## 2016-10-14 ENCOUNTER — Encounter: Payer: Medicaid Other | Admitting: Nurse Practitioner

## 2016-10-21 ENCOUNTER — Other Ambulatory Visit: Payer: Self-pay

## 2016-10-21 ENCOUNTER — Ambulatory Visit (INDEPENDENT_AMBULATORY_CARE_PROVIDER_SITE_OTHER): Payer: Medicaid Other | Admitting: Nurse Practitioner

## 2016-10-21 ENCOUNTER — Encounter: Payer: Self-pay | Admitting: Nurse Practitioner

## 2016-10-21 VITALS — BP 117/54 | HR 59 | Temp 97.7°F | Ht 66.0 in | Wt 178.4 lb

## 2016-10-21 DIAGNOSIS — Z1211 Encounter for screening for malignant neoplasm of colon: Secondary | ICD-10-CM | POA: Diagnosis not present

## 2016-10-21 DIAGNOSIS — Z13820 Encounter for screening for osteoporosis: Secondary | ICD-10-CM

## 2016-10-21 DIAGNOSIS — Z0000 Encounter for general adult medical examination without abnormal findings: Secondary | ICD-10-CM | POA: Diagnosis not present

## 2016-10-21 DIAGNOSIS — Z1231 Encounter for screening mammogram for malignant neoplasm of breast: Secondary | ICD-10-CM

## 2016-10-21 DIAGNOSIS — E782 Mixed hyperlipidemia: Secondary | ICD-10-CM

## 2016-10-21 DIAGNOSIS — Z862 Personal history of diseases of the blood and blood-forming organs and certain disorders involving the immune mechanism: Secondary | ICD-10-CM

## 2016-10-21 DIAGNOSIS — F419 Anxiety disorder, unspecified: Secondary | ICD-10-CM

## 2016-10-21 DIAGNOSIS — Z1239 Encounter for other screening for malignant neoplasm of breast: Secondary | ICD-10-CM

## 2016-10-21 LAB — CBC WITH DIFFERENTIAL/PLATELET
Basophils Absolute: 0 cells/uL (ref 0–200)
Basophils Relative: 0 %
Eosinophils Absolute: 60 cells/uL (ref 15–500)
Eosinophils Relative: 1 %
HCT: 38.5 % (ref 35.0–45.0)
Hemoglobin: 12.5 g/dL (ref 11.7–15.5)
Lymphocytes Relative: 36 %
Lymphs Abs: 2160 cells/uL (ref 850–3900)
MCH: 27.2 pg (ref 27.0–33.0)
MCHC: 32.5 g/dL (ref 32.0–36.0)
MCV: 83.9 fL (ref 80.0–100.0)
MPV: 8.9 fL (ref 7.5–12.5)
Monocytes Absolute: 300 cells/uL (ref 200–950)
Monocytes Relative: 5 %
Neutro Abs: 3480 cells/uL (ref 1500–7800)
Neutrophils Relative %: 58 %
Platelets: 224 10*3/uL (ref 140–400)
RBC: 4.59 MIL/uL (ref 3.80–5.10)
RDW: 13.8 % (ref 11.0–15.0)
WBC: 6 10*3/uL (ref 3.8–10.8)

## 2016-10-21 LAB — TSH: TSH: 0.77 mIU/L

## 2016-10-21 MED ORDER — ESCITALOPRAM OXALATE 10 MG PO TABS: 10.0000 mg | ORAL_TABLET | Freq: Every day | ORAL | 0 refills | Status: AC

## 2016-10-21 NOTE — Progress Notes (Signed)
Subjective:    Patient ID: Heidi Palmer, female    DOB: Oct 04, 1955, 61 y.o.   MRN: [id number]  Heidi Palmer is a 61 y.o. female presenting on 10/21/2016 for Annual Exam (sweating, fainting. Concern her thyroid could be abnormal.)   HPI  Annual physical Patient has been feeling well.  They have no acute concerns today. Sleeps 2 hours per night interrupted.  Problem is staying asleep and going back to sleep.  Health Maintenance: Weight/BMI: stable, overweight Physical activity: regular, limited by left leg Diet: not much, sandwich or meal.  Sometimes nothing Seatbelt: always Sunscreen: not consistently PAP: 2012 - normal cells after prior abnormal pap. She Refuses a PAP today. Colonoscopy: due (no prior screening) -patient wants an order for cologard Tetanus: up to date DEXA: due - (no prior screening) - pt has history of traumatic broken bones in past. Mammogram: due (last one in 2009)  Anxiety Difficulty with irritability with other people.  Has anger/rage, low/poor appetite, difficulty sleeping, and worries about lots of things.  She did see a  Psychologist per my recommendation at last visit. She sawCounselor/Psychologist Dr. Samuella [place]Price [place] moods.  She reports he told her she needed to be started on medication, but he couldn't write a prescription.  GAD 7 : Generalized Anxiety Score 10/21/2016  Nervous, Anxious, on Edge 3  Control/stop worrying 3  Worry too much - different things 3  Trouble relaxing 2  Restless 3  Easily annoyed or irritable 3  Afraid - awful might happen 3  Total GAD 7 Score 20  Anxiety Difficulty Very difficult   Depression screen [facility] 2/9 10/21/2016 09/17/2016  Decreased Interest 2 2  Down, Depressed, Hopeless 2 3  PHQ - 2 Score 4 5  Altered sleeping 3 3  Tired, decreased energy 3 3  Change in appetite 3 3  Feeling bad or failure about yourself  2 3  Trouble concentrating 2 2  Moving slowly or fidgety/restless 2 2    Suicidal thoughts 1 2  PHQ-9 Score 20 23  Difficult doing work/chores Very difficult Very difficult    Past Medical History:  Diagnosis Date  . Abnormal Pap smear of cervix   . Anemia   . Anxiety   . Depression   . GERD (gastroesophageal reflux disease)   . Patient denies medical problems   . PTSD (post-traumatic stress disorder)   . Vaginal polyp    Past Surgical History:  Procedure Laterality Date  . COLPOSCOPY  2009  . NO PAST SURGERIES     Social History   Social History  . Marital status: Single    Spouse name: N/A  . Number of children: N/A  . Years of education: N/A   Occupational History  . Not on file.   Social History Main Topics  . Smoking status: Never Smoker  . Smokeless tobacco: Never Used  . Alcohol use No     Comment: past  . Drug use: No  . Sexual activity: Not on file   Other Topics Concern  . Not on file   Social History Narrative  . No narrative on file   Family History  Problem Relation Age of Onset  . Kidney disease Mother   . Liver disease Sister   . Diabetes Sister   . HIV Brother   . Kidney disease Maternal Grandmother   . Hypertension Brother   . Heart disease Brother   . Hyperlipidemia Brother    Current Outpatient  Prescriptions on File Prior to Visit  Medication Sig  . acetaminophen (TYLENOL) 500 MG tablet Take 500 mg by mouth as needed for mild pain.  Marland Kitchen gabapentin (NEURONTIN) 100 MG capsule Take 1 capsule (100 mg total) by mouth 3 (three) times daily as needed.   No current facility-administered medications on file prior to visit.     Review of Systems  Constitutional: Negative.   HENT: Negative.   Eyes: Negative.   Respiratory: Negative.   Cardiovascular: Negative.   Gastrointestinal: Negative.   Endocrine: Negative.   Genitourinary: Negative.   Skin: Negative.   Allergic/Immunologic: Negative.   Neurological: Negative.   Hematological: Negative.    Per HPI unless specifically indicated above       Objective:    BP (!) 117/54   Pulse (!) 59   Temp 97.7 F (36.5 C) (Oral)   Ht 5\' 6"  (1.676 m)   Wt 178 lb 6.4 oz (80.9 kg)   LMP 10/10/1996 (Approximate) Comment: States LMP was 20 years ago  BMI 28.79 kg/m   Wt Readings from Last 3 Encounters:  10/21/16 178 lb 6.4 oz (80.9 kg)  09/17/16 178 lb (80.7 kg)  09/03/16 175 lb 1.6 oz (79.4 kg)    Physical Exam  Constitutional: She is oriented to person, place, and time. She appears well-developed and well-nourished. No distress.  HENT:  Head: Normocephalic and atraumatic.  Right Ear: External ear normal.  Left Ear: External ear normal.  Nose: Nose normal.  Mouth/Throat: Oropharynx is clear and moist.  Eyes: Conjunctivae are normal. Pupils are equal, round, and reactive to light.  Neck: Normal range of motion. Neck supple. No JVD present. No tracheal deviation present. No thyromegaly present.  Cardiovascular: Normal rate, regular rhythm, normal heart sounds and intact distal pulses.   Pulmonary/Chest: Effort normal and breath sounds normal. No respiratory distress.  Abdominal: Soft. Bowel sounds are normal. She exhibits distension. She exhibits no mass. There is tenderness in the right lower quadrant and left lower quadrant. There is no rebound and no guarding.  Genitourinary:  Genitourinary Comments: Exam deferred by patient  Musculoskeletal: Normal range of motion.  Lymphadenopathy:    She has no cervical adenopathy.  Neurological: She is alert and oriented to person, place, and time. She has normal reflexes. No cranial nerve deficit. Coordination normal.  Skin: Skin is warm and dry.  Psychiatric: Her behavior is normal. Judgment and thought content normal. Her mood appears anxious. Her speech is rapid and/or pressured and tangential. Cognition and memory are normal. She expresses no suicidal ideation. She expresses no suicidal plans.   Results for orders placed or performed during the hospital encounter of 10/10/16  NM Myocar  Multi W/Spect W/Wall Motion / EF  Result Value Ref Range   Rest HR 59 bpm   Rest BP 134/71 mmHg   Exercise duration (sec) 0 sec   Percent HR 74 %   Exercise duration (min) 1 min   Estimated workload 1.0 METS   Peak HR 121 bpm   Peak BP 164/64 mmHg   MPHR 162 bpm   SSS 0    SRS 2    SDS 0    TID 1.13    LV sys vol 20 mL   LV dias vol 67 46 - 106 mL      Assessment & Plan:   Problem List Items Addressed This Visit    None    Visit Diagnoses    Annual physical exam    -  Primary  Well adult exam with no new physical exam findings.  Diffuse abdominal tenderness, history of constipation.  Pt declines additional workup.  Plan: 1. Pt defers/refuses PAP today.  Discussed risks vs. Benefits of testing and deferring pap today. 2. Check CBC,CMP, TSH, lipid panel for screening labs.    Relevant Orders   CBC with Differential/Platelet   Comprehensive metabolic panel   TSH   Lipid panel   Breast cancer screening     Pt last mammogram 2009.  Result unavailable.  Plan: 1. Screening mammogram order placed.  Pt will call to schedule appointment.  Information given.   Relevant Orders   MM DIGITAL SCREENING BILATERAL   Colon cancer screening     Pt has never had colon cancer screening. Prefers cologuard.  Plan: 1. Discussed options of cologuard vs colonoscopy.  Pt prefers cologuard.  Order placed.  Pt given instructions to call and confirm insurance coverage then to call clinic to confirm order.    Relevant Orders   Cologuard   Osteoporosis screening     Never screened in past.  History of traumatic fractures only.  Plan: 1. Screening bone density scan order placed.   Relevant Orders   DG Bone Density   Anxiety     Pt has received some counseling with minimal improvement in symptoms.  Recommendation by psychologist was to start medication.  Plan: 1. START escitalopram 10 mg once daily. 2. Return to clinic for followup of anxiety in 4 weeks on new medication. Pt plans to move  to [place] at the end of June.  Follow up will be before their move.    Relevant Medications   escitalopram (LEXAPRO) 10 MG tablet      Meds ordered this encounter  Medications  . escitalopram (LEXAPRO) 10 MG tablet    Sig: Take 1 tablet (10 mg total) by mouth daily.    Dispense:  30 tablet    Refill:  0    Order Specific Question:   Supervising Provider    Answer:   Smitty Cords [[id number]]      Follow up plan: Return in about 4 weeks (around 11/18/2016) for anxiety - new medication.  [name], DNP, AGPCNP-BC Adult Gerontology Primary Care Nurse Practitioner [facility] 10/21/2016, 10:58 AM

## 2016-10-21 NOTE — Progress Notes (Signed)
I have reviewed this encounter including the documentation in this note and/or discussed this patient with the provider, Wilhelmina McardleLauren Kennedy, AGPCNP-BC. I am certifying that I agree with the content of this note as supervising physician.  Saralyn PilarAlexander Antigone Crowell, DO [facility] 10/21/2016, 12:51 PM

## 2016-10-21 NOTE — Patient Instructions (Signed)
Heidi Palmer, Thank you for coming in to clinic today.  1. For your mammogram, call the office to schedule your appointment.  The order has already been written   - The DEXA bone density scan - they will call you.  2. For your colon cancer screening: Colon Cancer Screening: - For all adults age 59 and older, routine colon cancer screening is highly recommended. - Early detection of colon cancer is important, because often there are no warning signs or symptoms.  If colon cancer is found early, usually it can be cured. Advanced cancer is hard to treat.  - If you are not interested in Colonoscopy screening (if done and normal you could be cleared for 5 to 10 years until next due), then Cologuard is an excellent alternative for screening test for Colon Cancer. It is highly sensitive for detecting DNA of colon cancer from even the earliest stages. Also, there is NO bowel prep required. - If Cologuard is NEGATIVE, then it is good for 3 years before next due - If Cologuard is POSITIVE, then it is strongly advised to get a Colonoscopy, which allows the GI doctor to locate the source of the cancer or polyp (even very early stage) and treat it by removing it. ------------------------- If you would like to proceed with Cologuard (stool DNA test) - FIRST, call your insurance company and tell them you want to check cost of Cologuard tell them CPT Code 62692 (it may be completely covered with a small or no cost, OR max cost without any coverage is about $600). If you do NOT open the kit, and decide not to do the test, you will NOT be charged, you should contact the company to return the kit if you decide not to do the test. - If you want to proceed, you can notify us (office phone, MyChart Message, or at next visit) and we will order it for you. The test kit will be delivered to your house in about 1 week. Follow instructions to collect your stool sample.  You may call the company for any help or questions, 24/7  telephone support at [phone].  3. For your anxiety: - start taking escitalopram 10 mg once daily.  We can increase this dose if it starts helping and you need more medication.  It can take 4-6 weeks for full effect.  4. For your annual physical: - we are checking blood work.  Please schedule a follow-up appointment with [name], AGNP to Return in about 4 weeks (around 11/18/2016) for anxiety - new medication.  If you have any other questions or concerns, please feel free to call the clinic or send a message through MyChart. You may also schedule an earlier appointment if necessary.  [name], DNP, AGNP-BC Adult Gerontology Nurse Practitioner [facility], [facility]   Living With Anxiety After being diagnosed with an anxiety disorder, you may be relieved to know why you have felt or behaved a certain way. It is natural to also feel overwhelmed about the treatment ahead and what it will mean for your life. With care and support, you can manage this condition and recover from it. How to cope with anxiety Dealing with stress  Stress is your body's reaction to life changes and events, both good and bad. Stress can last just a few hours or it can be ongoing. Stress can play a major role in anxiety, so it is important to learn both how to cope with stress and how to think  about it differently. Talk with your health care provider or a counselor to learn more about stress reduction. He or she may suggest some stress reduction techniques, such as:  Music therapy. This can include creating or listening to music that you enjoy and that inspires you.  Mindfulness-based meditation. This involves being aware of your normal breaths, rather than trying to control your breathing. It can be done while sitting or walking.  Centering prayer. This is a kind of meditation that involves focusing on a word, phrase, or sacred image that is meaningful to you and that brings you  peace.  Deep breathing. To do this, expand your stomach and inhale slowly through your nose. Hold your breath for 3-5 seconds. Then exhale slowly, allowing your stomach muscles to relax.  Self-talk. This is a skill where you identify thought patterns that lead to anxiety reactions and correct those thoughts.  Muscle relaxation. This involves tensing muscles then relaxing them. Choose a stress reduction technique that fits your lifestyle and personality. Stress reduction techniques take time and practice. Set aside 5-15 minutes a day to do them. Therapists can offer training in these techniques. The training may be covered by some insurance plans. Other things you can do to manage stress include:  Keeping a stress diary. This can help you learn what triggers your stress and ways to control your response.  Thinking about how you respond to certain situations. You may not be able to control everything, but you can control your reaction.  Making time for activities that help you relax, and not feeling guilty about spending your time in this way. Therapy combined with coping and stress-reduction skills provides the best chance for successful treatment. Medicines  Medicines can help ease symptoms. Medicines for anxiety include:  Anti-anxiety drugs.  Antidepressants.  Beta-blockers. Medicines may be used as the main treatment for anxiety disorder, along with therapy, or if other treatments are not working. Medicines should be prescribed by a health care provider. Relationships  Relationships can play a big part in helping you recover. Try to spend more time connecting with trusted friends and family members. Consider going to couples counseling, taking family education classes, or going to family therapy. Therapy can help you and others better understand the condition. How to recognize changes in your condition Everyone has a different response to treatment for anxiety. Recovery from anxiety  happens when symptoms decrease and stop interfering with your daily activities at home or work. This may mean that you will start to:  Have better concentration and focus.  Sleep better.  Be less irritable.  Have more energy.  Have improved memory. It is important to recognize when your condition is getting worse. Contact your health care provider if your symptoms interfere with home or work and you do not feel like your condition is improving. Where to find help and support: You can get help and support from these sources:  Self-help groups.  Online and [employer].  A trusted spiritual leader.  Couples counseling.  Family education classes.  Family therapy. Follow these instructions at home:  Eat a healthy diet that includes plenty of vegetables, fruits, whole grains, low-fat dairy products, and lean protein. Do not eat a lot of foods that are high in solid fats, added sugars, or salt.  Exercise. Most adults should do the following:  Exercise for at least 150 minutes each week. The exercise should increase your heart rate and make you sweat (moderate-intensity exercise).  Strengthening exercises at least  twice a week.  Cut down on caffeine, tobacco, alcohol, and other potentially harmful substances.  Get the right amount and quality of sleep. Most adults need 7-9 hours of sleep each night.  Make choices that simplify your life.  Take over-the-counter and prescription medicines only as told by your health care provider.  Avoid caffeine, alcohol, and certain over-the-counter cold medicines. These may make you feel worse. Ask your pharmacist which medicines to avoid.  Keep all follow-up visits as told by your health care provider. This is important. Questions to ask your health care provider  Would I benefit from therapy?  How often should I follow up with a health care provider?  How long do I need to take medicine?  Are there any long-term side  effects of my medicine?  Are there any alternatives to taking medicine? Contact a health care provider if:  You have a hard time staying focused or finishing daily tasks.  You spend many hours a day feeling worried about everyday life.  You become exhausted by worry.  You start to have headaches, feel tense, or have nausea.  You urinate more than normal.  You have diarrhea. Get help right away if:  You have a racing heart and shortness of breath.  You have thoughts of hurting yourself or others. If you ever feel like you may hurt yourself or others, or have thoughts about taking your own life, get help right away. You can go to your nearest emergency department or call:  Your local emergency services (911 in the [place]  A suicide crisis helpline, such as the [place] at 615-103-0449. This is open 24-hours a day. Summary  Taking steps to deal with stress can help calm you.  Medicines cannot cure anxiety disorders, but they can help ease symptoms.  Family, friends, and partners can play a big part in helping you recover from an anxiety disorder. This information is not intended to replace advice given to you by your health care provider. Make sure you discuss any questions you have with your health care provider. Document Released: 05/21/2016 Document Revised: 05/21/2016 Document Reviewed: 05/21/2016 Elsevier Interactive Patient Education  2017 [employer].

## 2016-10-22 DIAGNOSIS — F419 Anxiety disorder, unspecified: Secondary | ICD-10-CM | POA: Insufficient documentation

## 2016-10-22 DIAGNOSIS — E782 Mixed hyperlipidemia: Secondary | ICD-10-CM | POA: Insufficient documentation

## 2016-10-22 LAB — COMPREHENSIVE METABOLIC PANEL
ALT: 15 U/L (ref 6–29)
AST: 17 U/L (ref 10–35)
Albumin: 4.2 g/dL (ref 3.6–5.1)
Alkaline Phosphatase: 89 U/L (ref 33–130)
BUN: 8 mg/dL (ref 7–25)
CO2: 25 mmol/L (ref 20–31)
Calcium: 9.4 mg/dL (ref 8.6–10.4)
Chloride: 106 mmol/L (ref 98–110)
Creat: 0.86 mg/dL (ref 0.50–1.05)
Glucose, Bld: 97 mg/dL (ref 65–99)
Potassium: 4 mmol/L (ref 3.5–5.3)
Sodium: 141 mmol/L (ref 135–146)
Total Bilirubin: 0.4 mg/dL (ref 0.2–1.2)
Total Protein: 7.5 g/dL (ref 6.1–8.1)

## 2016-10-22 LAB — LIPID PANEL
Cholesterol: 213 mg/dL — ABNORMAL HIGH (ref <200)
HDL: 55 mg/dL (ref >50)
LDL Cholesterol: 143 mg/dL — ABNORMAL HIGH (ref <100)
Total CHOL/HDL Ratio: 3.9 Ratio (ref <5.0)
Triglycerides: 77 mg/dL (ref <150)
VLDL: 15 mg/dL (ref <30)

## 2016-10-22 MED ORDER — ATORVASTATIN CALCIUM 10 MG PO TABS: 10.0000 mg | ORAL_TABLET | Freq: Every day | ORAL | 3 refills | Status: AC

## 2016-10-22 NOTE — Progress Notes (Signed)
The pt was notified about lab results. The pt verabalize understanding. She stated she was okay with starting a statin drug.

## 2016-10-22 NOTE — Addendum Note (Signed)
Addended by: Wilhelmina McardleKENNEDY, Haddie Bruhl R on: 10/22/2016 01:56 PM   Modules accepted: Orders

## 2016-10-30 ENCOUNTER — Ambulatory Visit
Admission: RE | Admit: 2016-10-30 | Discharge: 2016-10-30 | Disposition: A | Discharge status: Home / Self Care | Payer: Medicaid Other | Source: Ambulatory Visit | Attending: Nurse Practitioner | Admitting: Nurse Practitioner

## 2016-10-30 DIAGNOSIS — M810 Age-related osteoporosis without current pathological fracture: Secondary | ICD-10-CM | POA: Insufficient documentation

## 2016-10-30 DIAGNOSIS — Z1231 Encounter for screening mammogram for malignant neoplasm of breast: Secondary | ICD-10-CM | POA: Diagnosis not present

## 2016-10-30 DIAGNOSIS — Z1239 Encounter for other screening for malignant neoplasm of breast: Secondary | ICD-10-CM

## 2016-10-30 DIAGNOSIS — Z13820 Encounter for screening for osteoporosis: Secondary | ICD-10-CM | POA: Diagnosis not present

## 2016-10-30 LAB — HM MAMMOGRAPHY

## 2016-10-31 ENCOUNTER — Encounter: Payer: Self-pay | Admitting: Nurse Practitioner

## 2016-11-01 ENCOUNTER — Telehealth: Payer: Self-pay

## 2016-11-01 NOTE — Telephone Encounter (Signed)
LMTCB with any questions regarding results.

## 2016-11-01 NOTE — Telephone Encounter (Signed)
-----   Message from Galen ManilaLauren Renee Kennedy, NP sent at 10/31/2016  6:21 PM EDT ----- START taking a supplement with 500mg  Calcium plus Vitamin D three times daily.  One brand is OSCAL. You need to start taking a medication that will help prevent bone loss.  TO start this medication, you must go to see a dentist and get clearance.  Please call and schedule an appointment with a dentist to evaluate for bisphosphonate therapy. We can follow up in clinic together after you have done this to start your medication.  This is very important to prevent further bone loss and reduce your risk of fractures.

## 2016-11-06 ENCOUNTER — Encounter: Payer: Self-pay | Admitting: Nurse Practitioner

## 2016-11-06 ENCOUNTER — Ambulatory Visit (INDEPENDENT_AMBULATORY_CARE_PROVIDER_SITE_OTHER): Payer: Medicaid Other | Admitting: Nurse Practitioner

## 2016-11-06 VITALS — BP 122/63 | HR 63 | Ht 66.0 in | Wt 179.6 lb

## 2016-11-06 DIAGNOSIS — M810 Age-related osteoporosis without current pathological fracture: Secondary | ICD-10-CM | POA: Diagnosis not present

## 2016-11-06 DIAGNOSIS — E782 Mixed hyperlipidemia: Secondary | ICD-10-CM | POA: Diagnosis not present

## 2016-11-06 MED ORDER — CALCIUM CARBONATE-VITAMIN D 500-200 MG-UNIT PO TABS: 1.0000 | ORAL_TABLET | Freq: Two times a day (BID) | ORAL | 11 refills | Status: AC

## 2016-11-06 NOTE — Progress Notes (Signed)
Subjective:    Patient ID: Heidi Palmer, female    DOB: 1956/06/07, 61 y.o.   MRN: [id number]  Heidi Palmer is a [age] y.o. female presenting on 11/06/2016 for Hyperlipidemia (f/u lab results )   HPI Hyperlipidemia Eats well with lots of fruits, vegetables, nuts, lean meat.  AVOIDS fried foods.  Eats lots of sweets.  Starting to switch to fruits.  Stress level high.   Osteoporosis Last fracture 4 years ago. Walking, stepped wrong and heard a crack.   Has been taking One-a-day for women once per day. Some weight bearing exercise with walking and jogging.  NO upper arm weight bearing activity at this time Early Menopause in 20s is contributing factor to osteoporosis.  Social History  Substance Use Topics  . Smoking status: Never Smoker  . Smokeless tobacco: Never Used  . Alcohol use No     Comment: past    Review of Systems Per HPI unless specifically indicated above     Objective:    BP 122/63   Pulse 63   Ht 5\' 6"  (1.676 m)   Wt 179 lb 9.6 oz (81.5 kg)   LMP 10/10/1996 (Approximate) Comment: States LMP was 20 years ago  BMI 28.99 kg/m   Wt Readings from Last 3 Encounters:  11/06/16 179 lb 9.6 oz (81.5 kg)  10/21/16 178 lb 6.4 oz (80.9 kg)  09/17/16 178 lb (80.7 kg)    Physical Exam  Constitutional: She appears well-developed and well-nourished. No distress.  HENT:  Mouth/Throat: Uvula is midline, oropharynx is clear and moist and mucous membranes are normal. Abnormal dentition.    Cardiovascular: Normal rate, regular rhythm and normal heart sounds.   Pulmonary/Chest: Effort normal and breath sounds normal. No respiratory distress.  Musculoskeletal: Normal range of motion. She exhibits no edema, tenderness or deformity.  Vitals reviewed.  Results for orders placed or performed in visit on 10/31/16  HM MAMMOGRAPHY  Result Value Ref Range   HM Mammogram 0-4 Bi-Rad 0-4 Bi-Rad, Self Reported Normal      Assessment & Plan:   Problem List Items Addressed This  Visit      Musculoskeletal and Integument   Osteoporosis without pathological fracture - Primary    DEXA: last bone density scan 10/30/2016 indicates osteoporosis 10/30/2016 = Baseline DEXA scan  AP Spine L2-4 -> BMD 0.823 g/cm2     T score -3.1     Femur Right -> BMD 0.787 g/cm2     T score -1.8  Pt with early menopause in her 7820s.  Prior history of fracture > 4 years ago occurred while walking.  Plan: 1. START calcium and vitamin D supplementation 3 times daily.  One dose with multivitamin, two doses with calcium/vitamin D supplement similar to OSCAL. 2. LABS: vitamin D, 25 hydroxy and vitamin D 1,25 dihydroxy for assessment of vitamin D levels and metabolism. 3. Proceed with dental evaluation to consider fosamax for future treatment.      Relevant Medications   calcium-vitamin D (OSCAL WITH D) 500-200 MG-UNIT tablet   Other Relevant Orders   Vitamin D 1,25 dihydroxy   Vitamin D 25 hydroxy     Other   Mixed hyperlipidemia    Reviewed labs.  Pt without understanding of results or request for new medication. Pt currently eating diet low in fat.  Pt identifies area to improve with reducing sweets.  Plan: 1. START taking atorvastatin 10 mg once daily.  START taking 1,000 mg fish oil daily. 2. Continue eating lower fat  diet.  Reviewed Omega-3s and healthy diet. 3. Follow up in 3 months after starting atorvastatin for repeat lipid panel.         Meds ordered this encounter  Medications  . calcium-vitamin D (OSCAL WITH D) 500-200 MG-UNIT tablet    Sig: Take 1 tablet by mouth 2 (two) times daily.    Dispense:  60 tablet    Refill:  11    Order Specific Question:   Supervising Provider    Answer:   Smitty Cords [[id number]]      Follow up plan: Return in about 3 months (around 02/06/2017) for lipid, osteoporosis treatment.   [name], DNP, AGPCNP-BC Adult Gerontology Primary Care Nurse Practitioner [facility] 11/06/2016, 4:30 PM

## 2016-11-06 NOTE — Progress Notes (Signed)
I have reviewed this encounter including the documentation in this note and/or discussed this patient with the provider, Wilhelmina McardleLauren Kennedy, AGPCNP-BC. I am certifying that I agree with the content of this note as supervising physician.  Saralyn PilarAlexander Treyana Sturgell, DO [facility] 11/06/2016, 5:41 PM

## 2016-11-06 NOTE — Assessment & Plan Note (Addendum)
Reviewed labs.  Pt without understanding of results or request for new medication. Pt currently eating diet low in fat.  Pt identifies area to improve with reducing sweets.  Plan: 1. START taking atorvastatin 10 mg once daily.  START taking 1,000 mg fish oil daily. 2. Continue eating lower fat diet.  Reviewed Omega-3s and healthy diet. 3. Follow up in 3 months after starting atorvastatin for repeat lipid panel.

## 2016-11-06 NOTE — Assessment & Plan Note (Signed)
DEXA: last bone density scan 10/30/2016 indicates osteoporosis 10/30/2016 = Baseline DEXA scan  AP Spine L2-4 -> BMD 0.823 g/cm2     T score -3.1     Femur Right -> BMD 0.787 g/cm2     T score -1.8  Pt with early menopause in her 8920s.  Prior history of fracture > 4 years ago occurred while walking.  Plan: 1. START calcium and vitamin D supplementation 3 times daily.  One dose with multivitamin, two doses with calcium/vitamin D supplement similar to OSCAL. 2. LABS: vitamin D, 25 hydroxy and vitamin D 1,25 dihydroxy for assessment of vitamin D levels and metabolism. 3. Proceed with dental evaluation to consider fosamax for future treatment.

## 2016-11-06 NOTE — Patient Instructions (Addendum)
Rogers SeedsCatrina, Thank you for coming in to clinic today.  1. High Cholesterol: - Your HDL is good cholesterol and it is high enough.   - Your LDL is bad cholesterol.  This is high and need medicine to come down. - START taking atorvastatin 10 mg once daily.  Take at the same time each day.  Usually at the evening meal. - START taking 1,000 fish oil once daily.  2. For your osteoporosis: - CONTINUE your one-a day vitamin. - START taking OSCAL or similar calcium and vitamin D supplement twice per day with your lunch and evening meals or what would be those meal times.   - Call to find a dentist and get a clean bill of health for starting bisphosphonate therapy. - I would like to start Fosamax.  The information about this medicine is below.  I have provided it so you know what to do if you will need to start taking it and we can discuss your concerns before you start. - START working on weight bearing activities like walking, jogging, and arm weights/wall pushups.  Please schedule a follow-up appointment with Wilhelmina McardleLauren Emberleigh Reily, AGNP to Return in about 3 months (around 02/06/2017) for lipid, osteoporosis treatment.  If you have any other questions or concerns, please feel free to call the clinic or send a message through MyChart. You may also schedule an earlier appointment if necessary.    Osteoporosis Osteoporosis is the thinning and loss of density in the bones. Osteoporosis makes the bones more brittle, fragile, and likely to break (fracture). Over time, osteoporosis can cause the bones to become so weak that they fracture after a simple fall. The bones most likely to fracture are the bones in the hip, wrist, and spine. What are the causes? The exact cause is not known. What increases the risk? Anyone can develop osteoporosis. You may be at greater risk if you have a family history of the condition or have poor nutrition. You may also have a higher risk if you are:  Female.  59 years old or  older.  A smoker.  Not physically active.  White or Asian.  Slender. What are the signs or symptoms? A fracture might be the first sign of the disease, especially if it results from a fall or injury that would not usually cause a bone to break. Other signs and symptoms include:  Low back and neck pain.  Stooped posture.  Height loss. How is this diagnosed? To make a diagnosis, your health care provider may:  Take a medical history.  Perform a physical exam.  Order tests, such as:  A bone mineral density test.  A dual-energy X-ray absorptiometry test. How is this treated? The goal of osteoporosis treatment is to strengthen your bones to reduce your risk of a fracture. Treatment may involve:  Making lifestyle changes, such as:  Eating a diet rich in calcium.  Doing weight-bearing and muscle-strengthening exercises.  Stopping tobacco use.  Limiting alcohol intake.  Taking medicine to slow the process of bone loss or to increase bone density.  Monitoring your levels of calcium and vitamin D. Follow these instructions at home:  Include calcium and vitamin D in your diet. Calcium is important for bone health, and vitamin D helps the body absorb calcium.  Perform weight-bearing and muscle-strengthening exercises as directed by your health care provider.  Do not use any tobacco products, including cigarettes, chewing tobacco, and electronic cigarettes. If you need help quitting, ask your health care provider.  Limit your alcohol intake.  Take medicines only as directed by your health care provider.  Keep all follow-up visits as directed by your health care provider. This is important.  Take precautions at home to lower your risk of falling, such as:  Keeping rooms well lit and clutter free.  Installing safety rails on stairs.  Using rubber mats in the bathroom and other areas that are often wet or slippery. Get help right away if: You fall or injure  yourself. This information is not intended to replace advice given to you by your health care provider. Make sure you discuss any questions you have with your health care provider. Document Released: 03/06/2005 Document Revised: 10/30/2015 Document Reviewed: 11/04/2013 Elsevier Interactive Patient Education  2017 [employer]   Alendronate tablets or FOSAMAX What is this medicine? ALENDRONATE (a LEN droe nate) slows calcium loss from bones. It helps to make normal healthy bone and to slow bone loss in people with Paget's disease and osteoporosis. It may be used in others at risk for bone loss. This medicine may be used for other purposes; ask your health care provider or pharmacist if you have questions. COMMON BRAND NAME(S): Fosamax What should I tell my health care provider before I take this medicine? They need to know if you have any of these conditions: -dental disease -esophagus, stomach, or intestine problems, like acid reflux or GERD -kidney disease -low blood calcium -low vitamin D -problems sitting or standing 30 minutes -trouble swallowing -an unusual or allergic reaction to alendronate, other medicines, foods, dyes, or preservatives -pregnant or trying to get pregnant -breast-feeding How should I use this medicine? You must take this medicine exactly as directed or you will lower the amount of the medicine you absorb into your body or you may cause yourself harm. Take this medicine by mouth first thing in the morning, after you are up for the day. Do not eat or drink anything before you take your medicine. Swallow the tablet with a full glass (6 to 8 fluid ounces) of plain water. Do not take this medicine with any other drink. Do not chew or crush the tablet. After taking this medicine, do not eat breakfast, drink, or take any medicines or vitamins for at least 30 minutes. Sit or stand up for at least 30 minutes after you take this medicine; do not lie down. Do not take your  medicine more often than directed. Talk to your pediatrician regarding the use of this medicine in children. Special care may be needed. Overdosage: If you think you have taken too much of this medicine contact a poison control center or emergency room at once. NOTE: This medicine is only for you. Do not share this medicine with others. What if I miss a dose? If you miss a dose, do not take it later in the day. Continue your normal schedule starting the next morning. Do not take double or extra doses. What may interact with this medicine? -aluminum hydroxide -antacids -aspirin -calcium supplements -drugs for inflammation like ibuprofen, naproxen, and others -iron supplements -magnesium supplements -vitamins with minerals This list may not describe all possible interactions. Give your health care provider a list of all the medicines, herbs, non-prescription drugs, or dietary supplements you use. Also tell them if you smoke, drink alcohol, or use illegal drugs. Some items may interact with your medicine. What should I watch for while using this medicine? Visit your doctor or health care professional for regular checks ups. It may be some  time before you see benefit from this medicine. Do not stop taking your medicine except on your doctor's advice. Your doctor or health care professional may order blood tests and other tests to see how you are doing. You should make sure you get enough calcium and vitamin D while you are taking this medicine, unless your doctor tells you not to. Discuss the foods you eat and the vitamins you take with your health care professional. Some people who take this medicine have severe bone, joint, and/or muscle pain. This medicine may also increase your risk for a broken thigh bone. Tell your doctor right away if you have pain in your upper leg or groin. Tell your doctor if you have any pain that does not go away or that gets worse. This medicine can make you more  sensitive to the sun. If you get a rash while taking this medicine, sunlight may cause the rash to get worse. Keep out of the sun. If you cannot avoid being in the sun, wear protective clothing and use sunscreen. Do not use sun lamps or tanning beds/booths. What side effects may I notice from receiving this medicine? Side effects that you should report to your doctor or health care professional as soon as possible: -allergic reactions like skin rash, itching or hives, swelling of the face, lips, or tongue -black or tarry stools -bone, muscle or joint pain -changes in vision -chest pain -heartburn or stomach pain -jaw pain, especially after dental work -pain or trouble when swallowing -redness, blistering, peeling or loosening of the skin, including inside the mouth Side effects that usually do not require medical attention (report to your doctor or health care professional if they continue or are bothersome): -changes in taste -diarrhea or constipation -eye pain or itching -headache -nausea or vomiting -stomach gas or fullness This list may not describe all possible side effects. Call your doctor for medical advice about side effects. You may report side effects to FDA at 1-800-FDA-1088. Where should I keep my medicine? Keep out of the reach of children. Store at room temperature of 15 and 30 degrees C (59 and 86 degrees F). Throw away any unused medicine after the expiration date. NOTE: This sheet is a summary. It may not cover all possible information. If you have questions about this medicine, talk to your doctor, pharmacist, or health care provider.  2018 Elsevier/Gold Standard (2010-11-23 08:56:09)   [name], DNP, AGNP-BC Adult Gerontology Nurse Practitioner [facility], [facility]

## 2016-11-07 LAB — VITAMIN D 25 HYDROXY (VIT D DEFICIENCY, FRACTURES): Vit D, 25-Hydroxy: 22 ng/mL — ABNORMAL LOW (ref 30–100)

## 2016-11-08 LAB — VITAMIN D 1,25 DIHYDROXY
Vitamin D 1, 25 (OH)2 Total: 67 pg/mL (ref 18–72)
Vitamin D2 1, 25 (OH)2: <8 pg/mL
Vitamin D3 1, 25 (OH)2: 67 pg/mL

## 2016-11-19 ENCOUNTER — Ambulatory Visit: Payer: Medicaid Other | Admitting: Nurse Practitioner
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
